# Patient Record
Sex: Female | Born: 1990 | Race: Black or African American | Hispanic: No | Marital: Single | Smoking: Never smoker
Health system: Southern US, Community
[De-identification: ages and names within clinical notes are randomized; demographics above are authoritative.]

## PROBLEM LIST (undated history)

## (undated) DIAGNOSIS — G43909 Migraine, unspecified, not intractable, without status migrainosus: Secondary | ICD-10-CM

## (undated) HISTORY — PX: OTHER SURGICAL HISTORY: SHX169

## (undated) HISTORY — DX: Migraine, unspecified, not intractable, without status migrainosus: G43.909

---

## 2013-10-10 ENCOUNTER — Ambulatory Visit: Payer: Self-pay | Admitting: Neurology

## 2013-11-14 ENCOUNTER — Ambulatory Visit (INDEPENDENT_AMBULATORY_CARE_PROVIDER_SITE_OTHER): Payer: BC Managed Care – PPO | Admitting: Neurology

## 2013-11-14 ENCOUNTER — Encounter: Payer: Self-pay | Admitting: *Deleted

## 2013-11-14 ENCOUNTER — Encounter: Payer: Self-pay | Admitting: Neurology

## 2013-11-14 VITALS — BP 110/78 | HR 76 | Ht 67.32 in | Wt 127.0 lb

## 2013-11-14 DIAGNOSIS — R519 Headache, unspecified: Secondary | ICD-10-CM

## 2013-11-14 DIAGNOSIS — R292 Abnormal reflex: Secondary | ICD-10-CM

## 2013-11-14 DIAGNOSIS — M791 Myalgia, unspecified site: Secondary | ICD-10-CM

## 2013-11-14 DIAGNOSIS — R51 Headache: Secondary | ICD-10-CM

## 2013-11-14 DIAGNOSIS — R209 Unspecified disturbances of skin sensation: Secondary | ICD-10-CM

## 2013-11-14 DIAGNOSIS — G43809 Other migraine, not intractable, without status migrainosus: Secondary | ICD-10-CM | POA: Insufficient documentation

## 2013-11-14 DIAGNOSIS — IMO0001 Reserved for inherently not codable concepts without codable children: Secondary | ICD-10-CM

## 2013-11-14 LAB — C-REACTIVE PROTEIN: CRP: <0.5 mg/dL (ref <0.60)

## 2013-11-14 LAB — VITAMIN B12: VITAMIN B 12: 458 pg/mL (ref 211–911)

## 2013-11-14 MED ORDER — NORTRIPTYLINE HCL 10 MG PO CAPS: 10.0000 mg | ORAL_CAPSULE | Freq: Every day | ORAL | Status: AC

## 2013-11-14 NOTE — Patient Instructions (Signed)
1.  Check blood work 2.  MRI brain wwo contrast 3.  Start nortriptyline  at bedtime 4.  Return to clinic in 6-8 weeks

## 2013-11-14 NOTE — Progress Notes (Signed)
Note faxed.

## 2013-11-14 NOTE — Progress Notes (Signed)
[facility] Clinic Note - Initial Visit   Date: 11/14/2013  Christina Howe MRN: [id number] DOB: 1990/10/06   Dear Dr. Olevia Bowens:  Thank you for your kind referral of Christina Howe for consultation of generalized numbness/stingling. Although her history is well known to you, please allow [place] to reiterate it for the purpose of our medical record. The patient was accompanied to the clinic by self.     History of Present Illness: Christina Howe is a 23 y.o. right-handed [place] American female with history of migrines presenting for evaluation of numbness of her hands and feet.    Starting around June 2015, she developed swelling and painful tingling of her feet, which worsens as the day progresses. She also developed tingling in her forearms down to her hands bilaterally. She is no associated weakness. Symptoms are intermittent and she has not identified any triggers. Her tingling resolved by August, but she continues to have numbness in her hands and feet.  Numbness is improved when she is is not standing as much.  She feels as if symptoms are slowly improving over the past month.   She denies any recent travel, sick contacts, or illness preceding symptoms.  Of note, she has a history of migraines which started childhood. Headaches are usually located over the forehead, described as pounding.  Duration is several hours to all day, frequency is daily.  She endorses photophobia and phonophobia.  She takes exedrin 2-3 times per week, which seems to alleviate the pain.  She has been on medications on the past, but does not recall the names.  She also complaints of working neck pain and generalized body aches.  Mother also has history of migraines.  Out-side paper records, electronic medical record, and images have been reviewed where available and summarized as:  Labs 09/08/2013: Sodium 137, potassium 4.0, creatinine 0.8, glucose 107, AST 11, ALT <88, TSH 1.5 EKG  09/08/2013: Sinus arrhythmia  Past Medical History  Diagnosis Date  . Migraine headache     Past Surgical History  Procedure Laterality Date  . None       Medications:  None   Allergies:  Allergies  Allergen Reactions  . Amoxicillin   . Penicillins     Family History: Family History  Problem Relation Age of Onset  . Migraines Mother   . Hyperlipidemia Mother   . Asthma Sister   . Hyperlipidemia Maternal Grandmother     Social History: History   Social History  . Marital Status: Single    Spouse Name: N/A    Number of Children: N/A  . Years of Education: N/A   Occupational History  . Not on file.   Social History Main Topics  . Smoking status: Never Smoker   . Smokeless tobacco: Never Used  . Alcohol Use: Yes     Comment: Infrequent, usually once every few month  . Drug Use: No  . Sexual Activity: Not on file   Other Topics Concern  . Not on file   Social History Narrative   She lives with mother and step-father.   She is a Charity fundraiser at [employer], Chemical engineer in Industrial/product designer.            Review of Systems:  CONSTITUTIONAL: No fevers, chills, night sweats, or weight loss.   EYES: No visual changes or eye pain ENT: No hearing changes.  No history of nose bleeds.   RESPIRATORY: No cough, wheezing and shortness of breath.   CARDIOVASCULAR: Negative for chest pain,  and palpitations.   GI: Negative for abdominal discomfort, blood in stools or black stools.  No recent change in bowel habits.   GU:  No history of incontinence.   MUSCLOSKELETAL: No history of joint pain or swelling.  + myalgias.   SKIN: Negative for lesions, rash, and itching.   HEMATOLOGY/ONCOLOGY: Negative for prolonged bleeding, bruising easily, and swollen nodes.  No history of cancer.   ENDOCRINE: Negative for cold or heat intolerance, polydipsia or goiter.   PSYCH:  No depression or anxiety symptoms.   NEURO: As Above.   Vital Signs:  BP 110/78  Pulse 76  Ht 5' 7.32" (1.71 m)   Wt 127 lb (57.607 kg)  BMI 19.70 kg/m2  SpO2 98%   General Medical Exam:   General:  Well appearing, comfortable.   Eyes/ENT: see cranial nerve examination.   Neck: No masses appreciated.  Full range of motion without tenderness.  No carotid bruits. Respiratory:  Clear to auscultation, good air entry bilaterally.   Cardiac:  Regular rate and rhythm, no murmur.   Extremities:  No deformities, edema, or skin discoloration. Good capillary refill.   Skin:  Skin color, texture, turgor normal. No rashes or lesions.  Neurological Exam: MENTAL STATUS including orientation to time, place, person, recent and remote memory, attention span and concentration, language, and fund of knowledge is normal.  Speech is not dysarthric.  CRANIAL NERVES: II:  No visual field defects.  Unremarkable fundi.   III-IV-VI: Pupils equal round and reactive to light.  Normal conjugate, extra-ocular eye movements in all directions of gaze.  No nystagmus.  No ptosis.   V:  Normal facial sensation.   VII:  Normal facial symmetry and movements.  No pathologic facial reflexes.  VIII:  Normal hearing and vestibular function.   IX-X:  Normal palatal movement.   XI:  Normal shoulder shrug and head rotation.   XII:  Normal tongue strength and range of motion, no deviation or fasciculation.  MOTOR:  No atrophy, fasciculations or abnormal movements.  No pronator drift.  Tone is normal.    Right Upper Extremity:    Left Upper Extremity:    Deltoid  5/5   Deltoid  5/5   Biceps  5/5   Biceps  5/5   Triceps  5/5   Triceps  5/5   Wrist extensors  5/5   Wrist extensors  5/5   Wrist flexors  5/5   Wrist flexors  5/5   Finger extensors  5/5   Finger extensors  5/5   Finger flexors  5/5   Finger flexors  5/5   Dorsal interossei  5/5   Dorsal interossei  5/5   Abductor pollicis  5/5   Abductor pollicis  5/5   Tone (Ashworth scale)  0  Tone (Ashworth scale)  0   Right Lower Extremity:    Left Lower Extremity:    Hip flexors   5/5   Hip flexors  5/5   Hip extensors  5/5   Hip extensors  5/5   Knee flexors  5/5   Knee flexors  5/5   Knee extensors  5/5   Knee extensors  5/5   Dorsiflexors  5/5   Dorsiflexors  5/5   Plantarflexors  5/5   Plantarflexors  5/5   Toe extensors  5/5   Toe extensors  5/5   Toe flexors  5/5   Toe flexors  5/5   Tone (Ashworth scale)  0  Tone (Ashworth scale)  0  MSRs:  Right                                                                 Left brachioradialis 3+  brachioradialis 3+  biceps 3+  biceps 3+  triceps 3+  triceps 3+  patellar 3+  patellar 3+  ankle jerk 2+  ankle jerk 2+  Hoffman no  Hoffman no  plantar response down  plantar response down   SENSORY:  Normal and symmetric perception of light touch, pinprick, vibration, and proprioception.  Romberg's sign absent.   COORDINATION/GAIT: Normal finger-to- nose-finger and heel-to-shin.  Intact rapid alternating movements bilaterally.  Able to rise from a chair without using arms.  Gait narrow based and stable. Tandem and stressed gait intact.    IMPRESSION/PLAN: Christina Howe is a 23 year-old female presenting for evaluation of paresthesias of her hands and feet.  Her neurological exam is notable for generalized hyperreflexia and in the absence of other upper motor neuron findings, this may be age-appropriate and normal for patient..Sensation and motor strength is normal.  In the setting of new paresthesias and worsening headaches, I will order MRI brain wwo contrast to evaluate for structural abnormalities, namely demyelinating disease, although my suspicion is low.  Laboratory tests will include ESR, CRP, vitamin B12, copper, ANA, vitamin D.  If her imaging and laboratory testing is normal, her symptoms may be manifestation of migraine equivalent syndrome.  I discussed the possible diagnoses with the patient and recommended that since she is having daily headaches, preventative medication is needed.  Nortriptyline 89m qhs will  be started to help with headaches and paresthesias.  If symptoms improve with medication alone, would be reasonable to cancel MRI.  If not, NCS/EMG can be done going forward.  Return to clinic in 6-8 weeks.   The duration of this appointment visit was 40 minutes of face-to-face time with the patient.  Greater than 50% of this time was spent in counseling, explanation of diagnosis, planning of further management, and coordination of care.   Thank you for allowing me to participate in patient's care.  If I can answer any additional questions, I would be pleased to do so.    Sincerely,    Indira Sorenson K. PPosey Pronto DO

## 2013-11-15 LAB — SEDIMENTATION RATE: Sed Rate: 3 mm/hr (ref 0–22)

## 2013-11-15 LAB — ANA: ANA: POSITIVE — AB

## 2013-11-15 LAB — ANTI-NUCLEAR AB-TITER (ANA TITER)

## 2013-11-15 LAB — VITAMIN D 25 HYDROXY (VIT D DEFICIENCY, FRACTURES): Vit D, 25-Hydroxy: 19 ng/mL — ABNORMAL LOW (ref 30–89)

## 2013-11-15 LAB — COPPER, SERUM: Copper: 91 ug/dL (ref 70–175)

## 2013-11-18 ENCOUNTER — Ambulatory Visit
Admission: RE | Admit: 2013-11-18 | Discharge: 2013-11-18 | Disposition: A | Discharge status: Home / Self Care | Payer: BC Managed Care – PPO | Source: Ambulatory Visit | Attending: Neurology | Admitting: Neurology

## 2013-11-18 DIAGNOSIS — R209 Unspecified disturbances of skin sensation: Secondary | ICD-10-CM

## 2013-11-18 DIAGNOSIS — M791 Myalgia, unspecified site: Secondary | ICD-10-CM

## 2013-11-18 DIAGNOSIS — R51 Headache: Secondary | ICD-10-CM

## 2013-11-18 DIAGNOSIS — R292 Abnormal reflex: Secondary | ICD-10-CM

## 2013-11-18 MED ORDER — GADOBENATE DIMEGLUMINE 529 MG/ML IV SOLN: 10.0000 mL | Freq: Once | INTRAVENOUS | Status: AC | PRN

## 2014-01-05 ENCOUNTER — Ambulatory Visit: Payer: BC Managed Care – PPO | Admitting: Neurology

## 2014-01-08 ENCOUNTER — Encounter: Payer: Self-pay | Admitting: *Deleted

## 2014-01-08 ENCOUNTER — Telehealth: Payer: Self-pay | Admitting: Neurology

## 2014-01-08 NOTE — Progress Notes (Signed)
No show letter sent for 01/05/2014 

## 2014-01-08 NOTE — Telephone Encounter (Signed)
Pt no showed 01/05/14 follow up appt w/ Dr. Allena KatzPatel.  Alcario DroughtErica - please send no show letter / Oneita KrasSherri S.

## 2015-12-16 ENCOUNTER — Emergency Department (HOSPITAL_COMMUNITY)
Admission: EM | Admit: 2015-12-16 | Discharge: 2015-12-16 | Disposition: A | Discharge status: Home / Self Care | Payer: Federal, State, Local not specified - PPO | Attending: Emergency Medicine | Admitting: Emergency Medicine

## 2015-12-16 ENCOUNTER — Encounter (HOSPITAL_COMMUNITY): Payer: Self-pay | Admitting: Emergency Medicine

## 2015-12-16 ENCOUNTER — Emergency Department (HOSPITAL_COMMUNITY): Payer: Federal, State, Local not specified - PPO

## 2015-12-16 DIAGNOSIS — Y999 Unspecified external cause status: Secondary | ICD-10-CM | POA: Diagnosis not present

## 2015-12-16 DIAGNOSIS — Z7982 Long term (current) use of aspirin: Secondary | ICD-10-CM | POA: Diagnosis not present

## 2015-12-16 DIAGNOSIS — R52 Pain, unspecified: Secondary | ICD-10-CM

## 2015-12-16 DIAGNOSIS — Y939 Activity, unspecified: Secondary | ICD-10-CM | POA: Diagnosis not present

## 2015-12-16 DIAGNOSIS — Y92410 Unspecified street and highway as the place of occurrence of the external cause: Secondary | ICD-10-CM | POA: Insufficient documentation

## 2015-12-16 DIAGNOSIS — M79605 Pain in left leg: Secondary | ICD-10-CM

## 2015-12-16 DIAGNOSIS — S8992XA Unspecified injury of left lower leg, initial encounter: Secondary | ICD-10-CM | POA: Diagnosis not present

## 2015-12-16 LAB — I-STAT BETA HCG BLOOD, ED (MC, WL, AP ONLY): I-stat hCG, quantitative: <5 m[IU]/mL (ref <5)

## 2015-12-16 MED ORDER — NAPROXEN 375 MG PO TABS: 375.0000 mg | ORAL_TABLET | Freq: Two times a day (BID) | ORAL | 0 refills | Status: AC | PRN

## 2015-12-16 MED ORDER — CYCLOBENZAPRINE HCL 10 MG PO TABS: 10.0000 mg | ORAL_TABLET | Freq: Three times a day (TID) | ORAL | 0 refills | Status: AC | PRN

## 2015-12-16 MED ORDER — KETOROLAC TROMETHAMINE 60 MG/2ML IM SOLN: 60.0000 mg | Freq: Once | INTRAMUSCULAR | Status: DC

## 2015-12-16 MED ORDER — HYDROCODONE-ACETAMINOPHEN 5-325 MG PO TABS
1.0000 | ORAL_TABLET | Freq: Once | ORAL | Status: AC
  Administered 2015-12-16: 1 via ORAL
  Filled 2015-12-16: qty 1
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
## 2015-12-16 NOTE — ED Notes (Signed)
Pt returned from X-ray at this time.  Pt in no apparent distress at this time.

## 2015-12-16 NOTE — ED Notes (Signed)
Patient transported to X-ray at this time via ED stretcher.  Pt in no apparent distress at this time.   

## 2015-12-16 NOTE — ED Notes (Signed)
Pt provided with d/c instructions at this time. Pt verbalizes understanding of d/c instructions as well as follow up procedure after d/c.  Pt provided with RX for naproxen and flexeril at this time. Pt verbalizes understanding of RX directions. Pt in no apparent distress at this time. Pt ambulatory at time of d/c.
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
